# Patient Record
Sex: Female | Born: 1980 | Race: White | Hispanic: No | Marital: Single | Smoking: Never smoker
Health system: Southern US, Community
[De-identification: ages and names within clinical notes are randomized; demographics above are authoritative.]

## PROBLEM LIST (undated history)

## (undated) ENCOUNTER — Ambulatory Visit: Payer: BC Managed Care – PPO

---

## 2013-09-06 ENCOUNTER — Ambulatory Visit: Payer: Self-pay | Admitting: Family Medicine

## 2013-09-06 LAB — PREGNANCY, URINE: Pregnancy Test, Urine: NEGATIVE m[IU]/mL

## 2013-09-06 LAB — URINALYSIS, COMPLETE
Bilirubin,UR: NEGATIVE
Blood: NEGATIVE
Glucose,UR: NEGATIVE mg/dL (ref 0–75)
Nitrite: NEGATIVE
PH: 6.5 (ref 4.5–8.0)
Protein: NEGATIVE
RBC, UR: NONE SEEN /HPF (ref 0–5)
SPECIFIC GRAVITY: 1.01 (ref 1.003–1.030)

## 2015-11-13 IMAGING — CR DG LUMBAR SPINE 2-3V
1 series · 3 of 3 positions shown · non-contrast
Comparison: None.

CLINICAL DATA: Low back pain

EXAM:
LUMBAR SPINE - 2-3 VIEW

[Series 1: ap · 0.17mm/px · 3 of 3 slices shown]
[im 1/3]
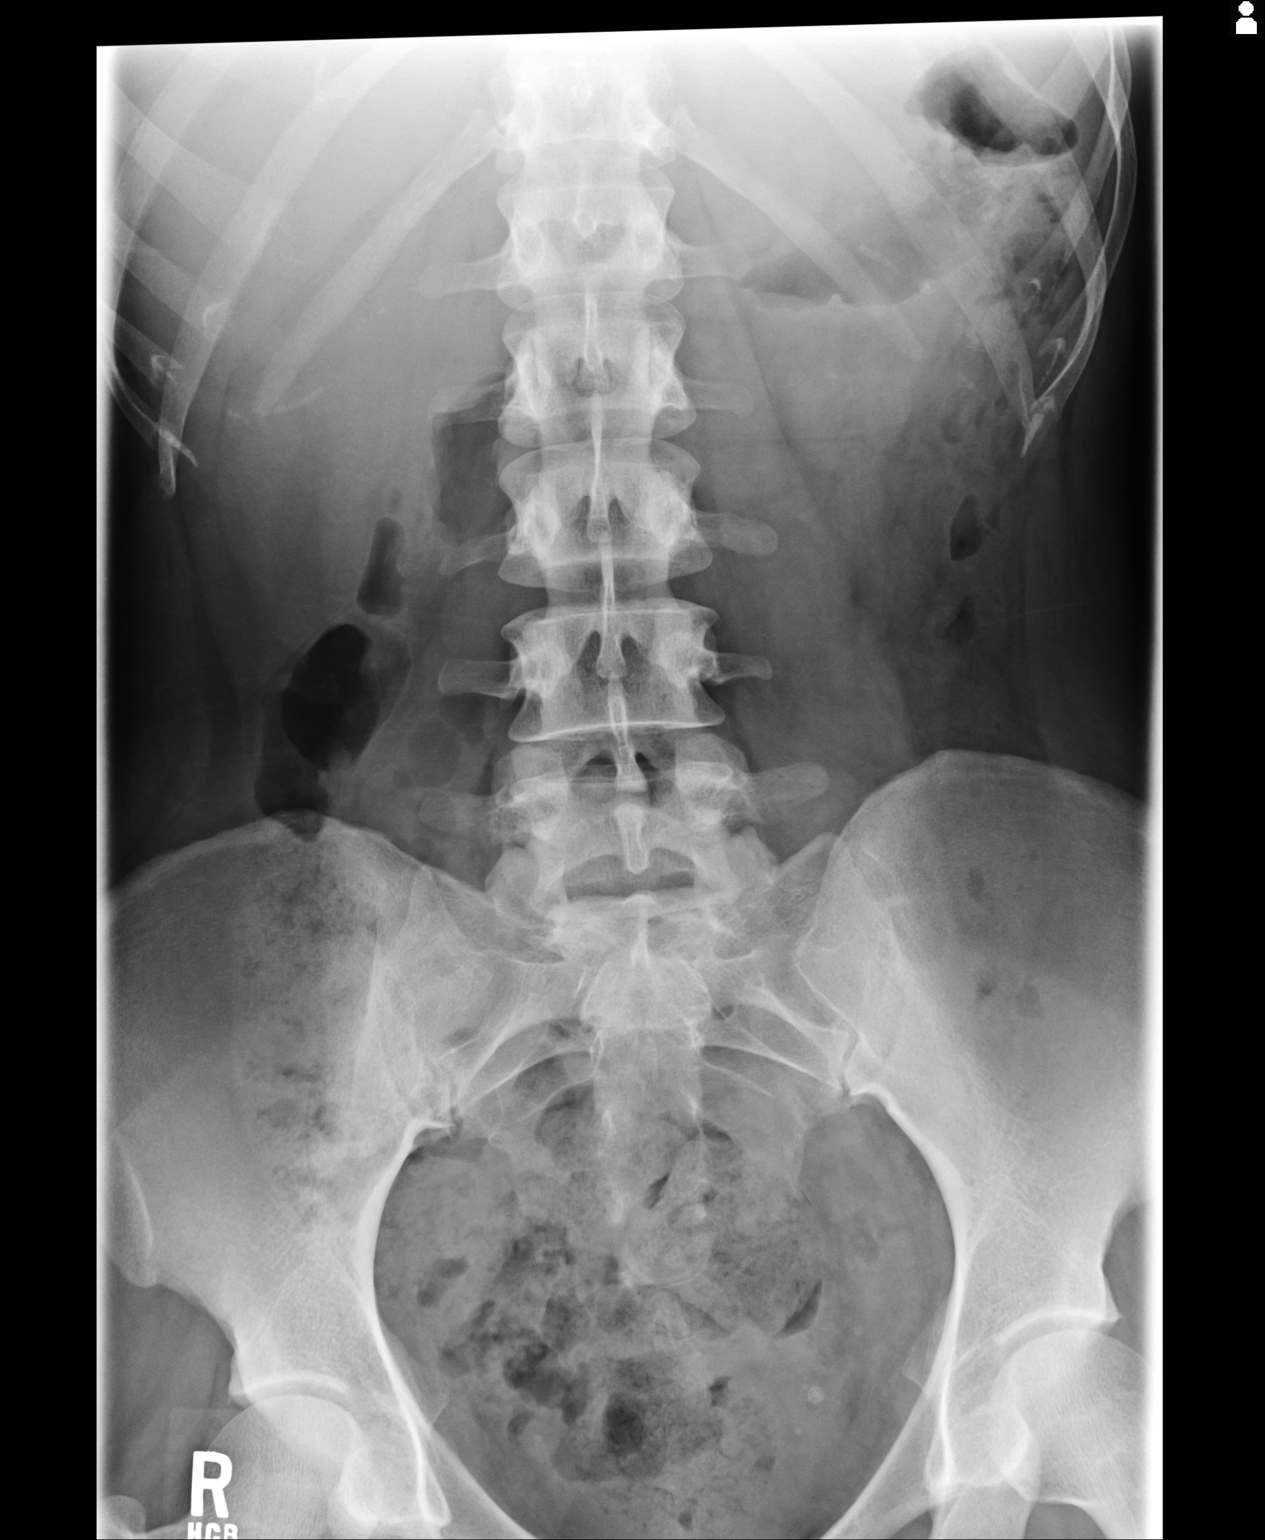
[im 2/3]
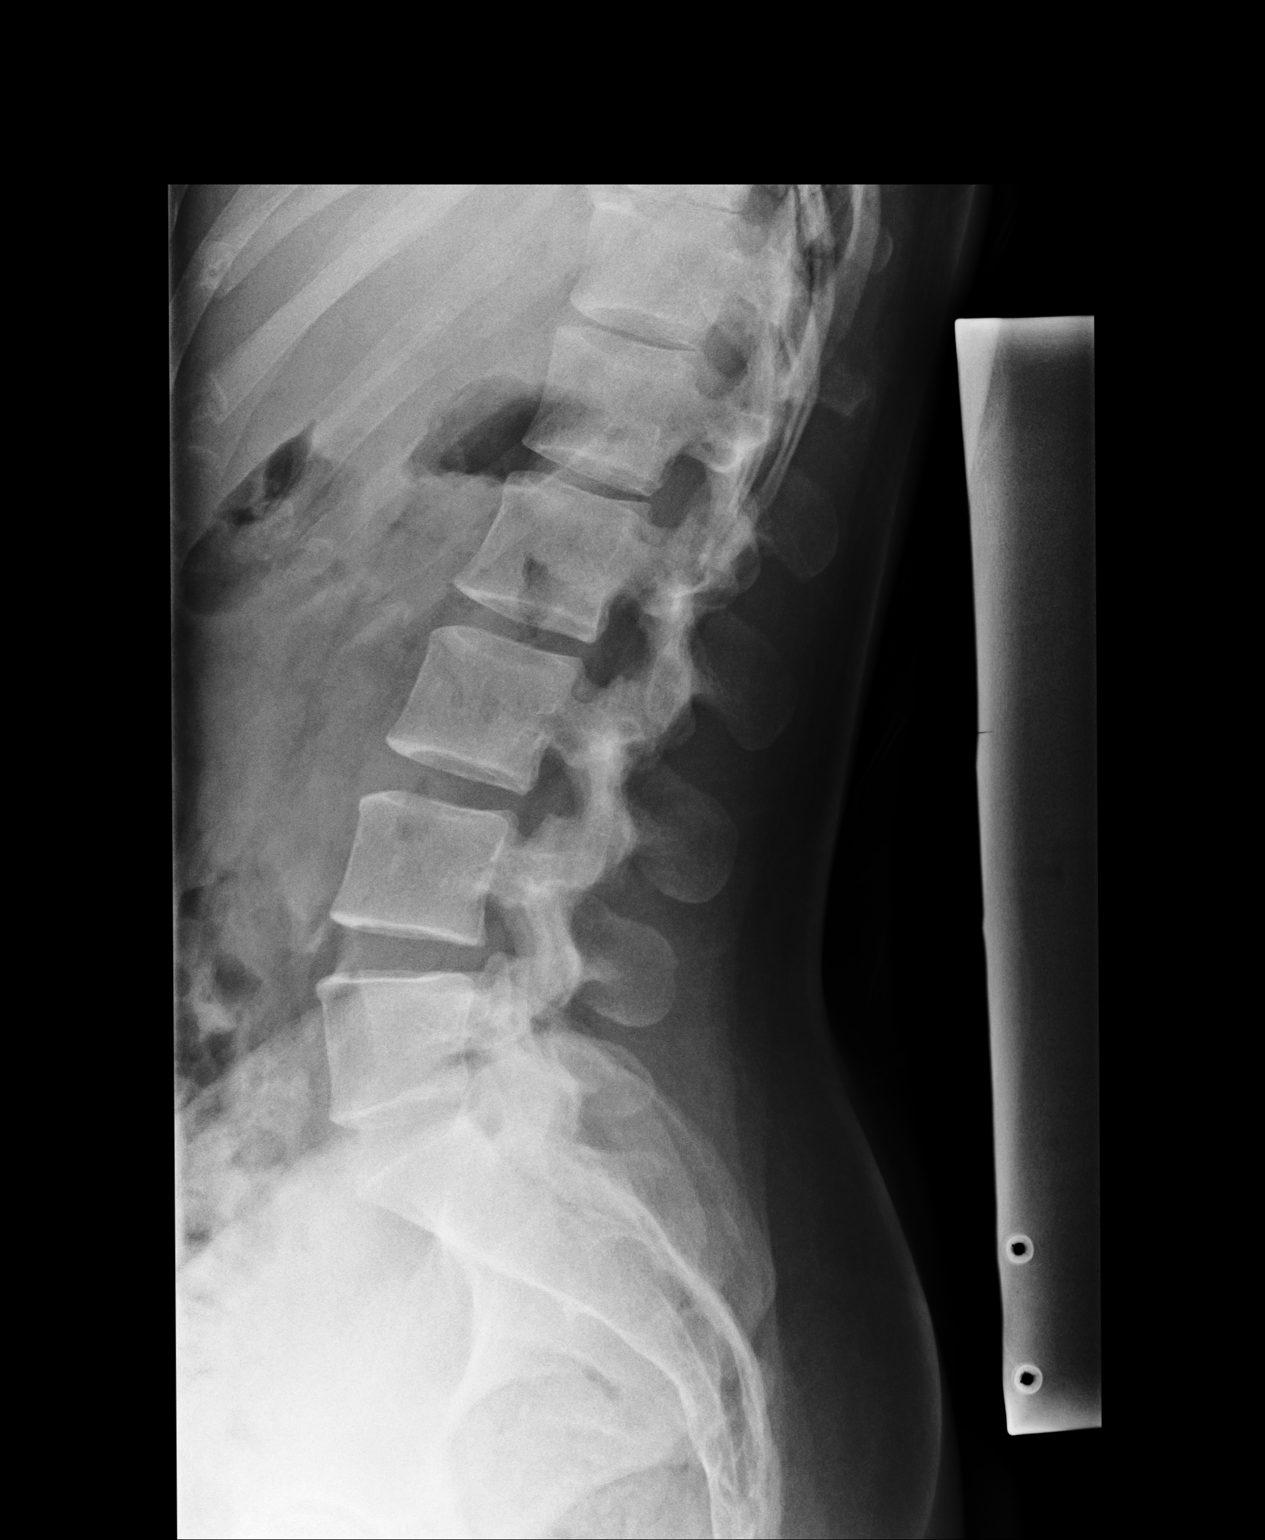
[im 3/3]
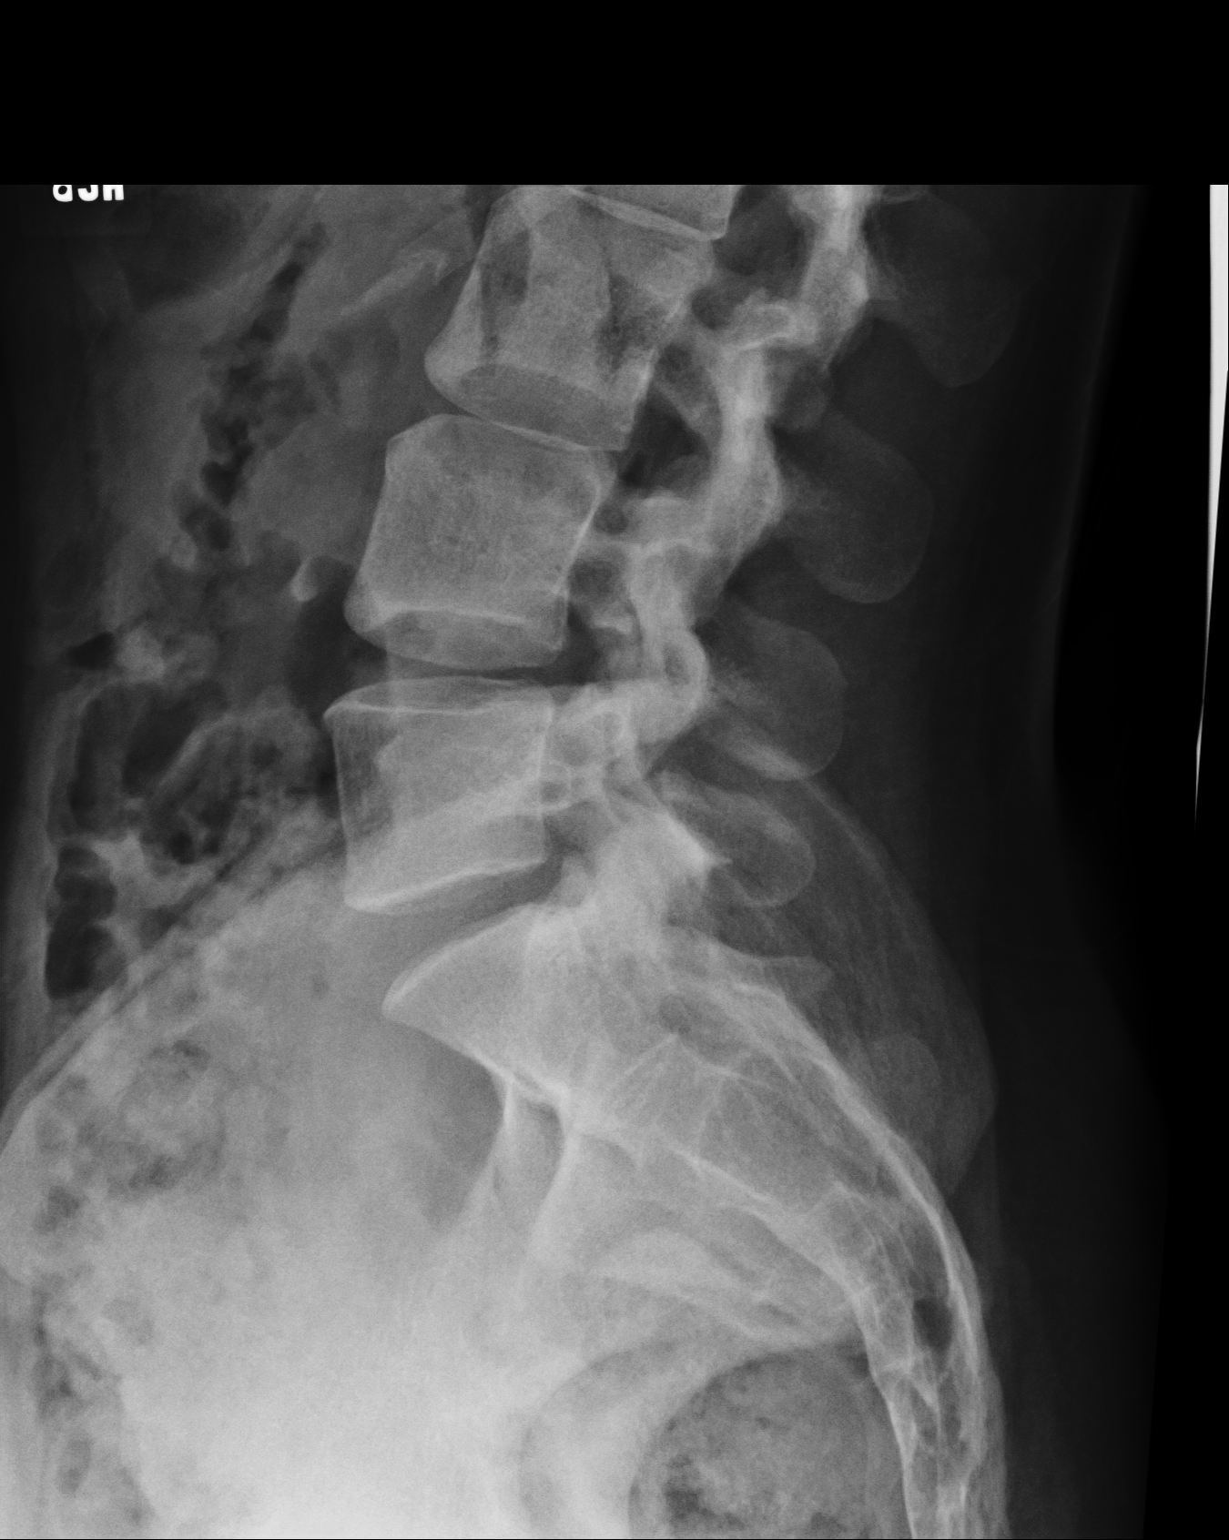

[3 of 3 positions shown; findings below may reference images not displayed]

FINDINGS: Frontal, lateral, and spot lumbosacral lateral images were obtained.
There are 5 non-rib-bearing lumbar type vertebral bodies. There is
no fracture or spondylolisthesis. Disc spaces appear intact. No
erosive change.
IMPRESSION: No fracture or appreciable arthropathic change.

## 2020-09-10 ENCOUNTER — Other Ambulatory Visit: Payer: Self-pay

## 2020-09-10 ENCOUNTER — Encounter: Payer: Self-pay | Admitting: Emergency Medicine

## 2020-09-10 ENCOUNTER — Ambulatory Visit
Admission: EM | Admit: 2020-09-10 | Discharge: 2020-09-10 | Disposition: A | Discharge status: Home / Self Care | Payer: BC Managed Care – PPO | Attending: Family Medicine | Admitting: Family Medicine

## 2020-09-10 DIAGNOSIS — R059 Cough, unspecified: Secondary | ICD-10-CM | POA: Diagnosis present

## 2020-09-10 DIAGNOSIS — J111 Influenza due to unidentified influenza virus with other respiratory manifestations: Secondary | ICD-10-CM | POA: Diagnosis not present

## 2020-09-10 DIAGNOSIS — Z20822 Contact with and (suspected) exposure to covid-19: Secondary | ICD-10-CM | POA: Diagnosis not present

## 2020-09-10 LAB — RESP PANEL BY RT-PCR (FLU A&B, COVID) ARPGX2
Influenza A by PCR: POSITIVE — AB
Influenza B by PCR: NEGATIVE
SARS Coronavirus 2 by RT PCR: NEGATIVE

## 2020-09-10 MED ORDER — OSELTAMIVIR PHOSPHATE 75 MG PO CAPS: 75.0000 mg | ORAL_CAPSULE | Freq: Two times a day (BID) | ORAL | 0 refills | Status: AC

## 2020-09-10 MED ORDER — XOFLUZA (80 MG DOSE) 2 X 40 MG PO TBPK: 80.0000 mg | ORAL_TABLET | Freq: Once | ORAL | 0 refills | Status: AC

## 2020-09-10 NOTE — ED Provider Notes (Signed)
[facility]    CSN: [id number] Arrival date & time: 09/10/20  1724      History   Chief Complaint Chief Complaint  Patient presents with  . Cough   HPI  40 year old female presents with respiratory symptoms and diarrhea.  Patient reports that she has been sick for the past 2 days.  One of her children has been sick and the other seems to be getting sick as well.  She reports cough, runny nose, sore throat, congestion.  Has had fever as well.  Has now developed diarrhea.  Concerned that she may have influenza.  Pain 3/10 in severity.  No relieving factors.  No other complaints.  Home Medications    Prior to Admission medications   Medication Sig Start Date End Date Taking? Authorizing Provider  Baloxavir Marboxil,80 MG Dose, (XOFLUZA, 80 MG DOSE,) 2 x 40 MG TBPK Take 80 mg by mouth once for 1 dose. 09/10/20 09/10/20 Yes Tommie Sams, DO    Family History History reviewed. No pertinent family history.  Social History Social History   Tobacco Use  . Smoking status: Never Smoker  . Smokeless tobacco: Never Used  Vaping Use  . Vaping Use: Never used  Substance Use Topics  . Alcohol use: Never     Allergies   Patient has no known allergies.   Review of Systems Review of Systems  Constitutional: Positive for fever.  HENT: Positive for congestion and sore throat.   Respiratory: Positive for cough.   Gastrointestinal: Positive for diarrhea.   Physical Exam Triage Vital Signs ED Triage Vitals  Enc Vitals Group     BP 09/10/20 1803 107/72     Pulse Rate 09/10/20 1803 (!) 112     Resp 09/10/20 1803 14     Temp 09/10/20 1803 99.5 F (37.5 C)     Temp Source 09/10/20 1803 Oral     SpO2 09/10/20 1803 99 %     Weight 09/10/20 1759 180 lb (81.6 kg)     Height 09/10/20 1759 5\' 8"  (1.727 m)     Head Circumference --      Peak Flow --      Pain Score 09/10/20 1759 3     Pain Loc --      Pain Edu? --      Excl. in GC? --    Updated Vital Signs BP  107/72 (BP Location: Right Arm)   Pulse (!) 112   Temp 99.5 F (37.5 C) (Oral)   Resp 14   Ht 5\' 8"  (1.727 m)   Wt 81.6 kg   LMP 08/27/2020 (Approximate)   SpO2 99%   BMI 27.37 kg/m   Visual Acuity Right Eye Distance:   Left Eye Distance:   Bilateral Distance:    Right Eye Near:   Left Eye Near:    Bilateral Near:     Physical Exam Vitals and nursing note reviewed.  Constitutional:      General: She is not in acute distress.    Appearance: She is not ill-appearing.  HENT:     Head: Normocephalic and atraumatic.     Right Ear: Tympanic membrane normal.     Left Ear: Tympanic membrane normal.     Nose: Congestion present.  Eyes:     General:        Right eye: No discharge.        Left eye: No discharge.     Conjunctiva/sclera: Conjunctivae normal.  Cardiovascular:  Rate and Rhythm: Regular rhythm. Tachycardia present.  Pulmonary:     Effort: Pulmonary effort is normal.     Breath sounds: Normal breath sounds. No wheezing, rhonchi or rales.  Neurological:     Mental Status: She is alert.  Psychiatric:        Mood and Affect: Mood normal.        Behavior: Behavior normal.    UC Treatments / Results  Labs (all labs ordered are listed, but only abnormal results are displayed) Labs Reviewed  RESP PANEL BY RT-PCR (FLU A&B, COVID) ARPGX2 - Abnormal; Notable for the following components:      Result Value   Influenza A by PCR POSITIVE (*)    All other components within normal limits    EKG   Radiology No results found.  Procedures Procedures (including critical care time)  Medications Ordered in UC Medications - No data to display  Initial Impression / Assessment and Plan / UC Course  I have reviewed the triage vital signs and the nursing notes.  Pertinent labs & imaging results that were available during my care of the patient were reviewed by me and considered in my medical decision making (see chart for details).    40 year old female presents  with influenza.  This is an acute illness with systemic symptoms as she has had ongoing fever.  Treating with Xofluza.  Final Clinical Impressions(s) / UC Diagnoses   Final diagnoses:  Influenza   Discharge Instructions   None    ED Prescriptions    Medication Sig Dispense Auth. Provider   Baloxavir Marboxil,80 MG Dose, (XOFLUZA, 80 MG DOSE,) 2 x 40 MG TBPK Take 80 mg by mouth once for 1 dose. 2 each Tommie Sams, DO     PDMP not reviewed this encounter.   Tommie Sams, [place] 09/10/20 1916

## 2020-09-10 NOTE — ED Triage Notes (Signed)
Patient c/o cough, runny nose, sinus pressure for the past 2 days. Patient reports diarrhea that started this morning.  Patient reports low grade fevers.
